# Patient Record
Sex: Male | Born: 1965 | Race: White | Hispanic: No | Marital: Married | Smoking: Never smoker
Health system: Southern US, Community
[De-identification: ages and names within clinical notes are randomized; demographics above are authoritative.]

## PROBLEM LIST (undated history)

## (undated) DIAGNOSIS — I10 Essential (primary) hypertension: Secondary | ICD-10-CM

## (undated) DIAGNOSIS — L4050 Arthropathic psoriasis, unspecified: Secondary | ICD-10-CM

## (undated) HISTORY — DX: Arthropathic psoriasis, unspecified: L40.50

## (undated) HISTORY — DX: Essential (primary) hypertension: I10

---

## 2019-06-11 ENCOUNTER — Encounter: Payer: Self-pay | Admitting: Sports Medicine

## 2019-06-11 ENCOUNTER — Ambulatory Visit (INDEPENDENT_AMBULATORY_CARE_PROVIDER_SITE_OTHER): Payer: BC Managed Care – PPO | Admitting: Sports Medicine

## 2019-06-11 ENCOUNTER — Ambulatory Visit (INDEPENDENT_AMBULATORY_CARE_PROVIDER_SITE_OTHER): Payer: BC Managed Care – PPO

## 2019-06-11 ENCOUNTER — Other Ambulatory Visit: Payer: Self-pay

## 2019-06-11 ENCOUNTER — Telehealth: Payer: Self-pay

## 2019-06-11 DIAGNOSIS — M25512 Pain in left shoulder: Secondary | ICD-10-CM

## 2019-06-11 DIAGNOSIS — M25511 Pain in right shoulder: Secondary | ICD-10-CM

## 2019-06-11 DIAGNOSIS — M25532 Pain in left wrist: Secondary | ICD-10-CM

## 2019-06-11 DIAGNOSIS — L4050 Arthropathic psoriasis, unspecified: Secondary | ICD-10-CM | POA: Diagnosis not present

## 2019-06-11 MED ORDER — MELOXICAM 15 MG PO TABS: ORAL_TABLET | ORAL | 3 refills | Status: AC

## 2019-06-11 NOTE — Assessment & Plan Note (Signed)
Currently under management with rheumatology on Cosentyx.

## 2019-06-11 NOTE — Telephone Encounter (Signed)
Patient advised.

## 2019-06-11 NOTE — Telephone Encounter (Signed)
I explained to him that there would be a period of worsening pain before he got better.  He can do some ibuprofen or meloxicam.

## 2019-06-11 NOTE — Assessment & Plan Note (Signed)
Bilateral glenohumeral joint injections today. Formal PT. Bilateral x-rays, bilateral MRIs. He does have significant weakness concerning for rotator cuff tear. Symptoms are also referrable to the biceps tendons. Also switching him to meloxicam.

## 2019-06-11 NOTE — Progress Notes (Signed)
Subjective:    CC: Bilateral shoulder pain  HPI:  This is a pleasant 53 year old male, from past several weeks has had severe pain in the anterior aspect of both shoulders, right worse than left, moderate sensation of instability, weakness without injury.  He does have a history of psoriatic arthritis currently on Cosentyx.  In addition he has noted swelling on his left volar wrist, radial aspect, with pain that is moderate, persistent, localized without radiation.  No paresthesias into the hand or fingertips.  No trauma.  I reviewed the past medical history, family history, social history, surgical history, and allergies today and no changes were needed.  Please see the problem list section below in epic for further details.  Past Medical History: Past Medical History:  Diagnosis Date  . Hypertension   . Psoriatic arthritis (HCC)    Past Surgical History: History reviewed. No pertinent surgical history. Social History: Social History   Socioeconomic History  . Marital status: Married    Spouse name: Not on file  . Number of children: Not on file  . Years of education: Not on file  . Highest education level: Not on file  Occupational History  . Not on file  Social Needs  . Financial resource strain: Not on file  . Food insecurity    Worry: Not on file    Inability: Not on file  . Transportation needs    Medical: Not on file    Non-medical: Not on file  Tobacco Use  . Smoking status: Not on file  Substance and Sexual Activity  . Alcohol use: Not on file  . Drug use: Not on file  . Sexual activity: Not on file  Lifestyle  . Physical activity    Days per week: Not on file    Minutes per session: Not on file  . Stress: Not on file  Relationships  . Social Musicianconnections    Talks on phone: Not on file    Gets together: Not on file    Attends religious service: Not on file    Active member of club or organization: Not on file    Attends meetings of clubs or  organizations: Not on file    Relationship status: Not on file  Other Topics Concern  . Not on file  Social History Narrative  . Not on file   Family History: No family history on file. Allergies: Not on File Medications: See med rec.  Review of Systems: No headache, visual changes, nausea, vomiting, diarrhea, constipation, dizziness, abdominal pain, skin rash, fevers, chills, night sweats, swollen lymph nodes, weight loss, chest pain, body aches, joint swelling, muscle aches, shortness of breath, mood changes, visual or auditory hallucinations.  Objective:    General: Well Developed, well nourished, and in no acute distress.  Neuro: Alert and oriented x3, extra-ocular muscles intact, sensation grossly intact.  HEENT: Normocephalic, atraumatic, pupils equal round reactive to light, neck supple, no masses, no lymphadenopathy, thyroid nonpalpable.  Skin: Warm and dry, no rashes noted.  Cardiac: Regular rate and rhythm, no murmurs rubs or gallops.  Respiratory: Clear to auscultation bilaterally. Not using accessory muscles, speaking in full sentences.  Abdominal: Soft, nontender, nondistended, positive bowel sounds, no masses, no organomegaly.  Bilateral shoulders: Inspection reveals no abnormalities, atrophy or asymmetry. Palpation is normal with no tenderness over AC joint or bicipital groove. ROM is full in all planes. Rotator cuff strength very weak to abduction Positive Neer and Hawkin's tests, empty can. Speeds and Yergason's tests positive.  No labral pathology noted with negative Obrien's, negative crank, negative clunk, and good stability. Normal scapular function observed. No painful arc and no drop arm sign. No apprehension sign Left wrist: Fullness over the left volar wrist, palpation is consistent with a volar ganglion ROM smooth and normal with good flexion and extension and ulnar/radial deviation that is symmetrical with opposite wrist. Palpation is normal over  metacarpals, navicular, lunate, and TFCC; tendons without tenderness/ swelling No snuffbox tenderness. No tenderness over Canal of Guyon. Strength 5/5 in all directions without pain. Negative tinel's and phalens signs. Negative Finkelstein sign. Negative Watson's test.  Procedure: Real-time Ultrasound Guided injection of the right glenohumeral joint Device: GE Logiq E  Verbal informed consent obtained.  Time-out conducted.  Noted no overlying erythema, induration, or other signs of local infection.  Skin prepped in a sterile fashion.  Local anesthesia: Topical Ethyl chloride.  With sterile technique and under real time ultrasound guidance:  1 cc Kenalog 40, 2 cc lidocaine, 2 cc bupivacaine injected easily. Completed without difficulty  Pain immediately resolved suggesting accurate placement of the medication.  Advised to call if fevers/chills, erythema, induration, drainage, or persistent bleeding.  Images permanently stored and available for review in the ultrasound unit.  Impression: Technically successful ultrasound guided injection.  Procedure: Real-time Ultrasound Guided injection of the left glenohumeral joint Device: GE Logiq E  Verbal informed consent obtained.  Time-out conducted.  Noted no overlying erythema, induration, or other signs of local infection.  Skin prepped in a sterile fashion.  Local anesthesia: Topical Ethyl chloride.  With sterile technique and under real time ultrasound guidance:  1 cc Kenalog 40, 2 cc lidocaine, 2 cc bupivacaine injected easily. Completed without difficulty  Pain immediately resolved suggesting accurate placement of the medication.  Advised to call if fevers/chills, erythema, induration, drainage, or persistent bleeding.  Images permanently stored and available for review in the ultrasound unit.  Impression: Technically successful ultrasound guided injection.   Impression and Recommendations:    The patient was counselled, risk  factors were discussed, anticipatory guidance given.  Psoriatic arthritis ([place]) Currently under management with rheumatology on Cosentyx.  Bilateral shoulder pain Bilateral glenohumeral joint injections today. Formal PT. Bilateral x-rays, bilateral MRIs. He does have significant weakness concerning for rotator cuff tear. Symptoms are also referrable to the biceps tendons. Also switching him to meloxicam.  Left wrist pain There is what appears to be a volar ganglion cyst, I do want some x-rays today.   ___________________________________________ Gwen Her. Dianah Field, M.D., ABFM., CAQSM. Primary Care and Sports Medicine [facility]  Adjunct Professor of [employer]

## 2019-06-11 NOTE — Assessment & Plan Note (Signed)
There is what appears to be a volar ganglion cyst, I do want some x-rays today.

## 2019-06-11 NOTE — Telephone Encounter (Signed)
[facility] called and states he is having a worsening of shoulder pain. He states he is having trouble lifting his arms up. He states he didn't have this before the injection.

## 2019-06-12 ENCOUNTER — Other Ambulatory Visit: Payer: Self-pay

## 2019-06-12 ENCOUNTER — Ambulatory Visit (INDEPENDENT_AMBULATORY_CARE_PROVIDER_SITE_OTHER): Payer: BC Managed Care – PPO

## 2019-06-12 DIAGNOSIS — R531 Weakness: Secondary | ICD-10-CM

## 2019-06-12 DIAGNOSIS — M25511 Pain in right shoulder: Secondary | ICD-10-CM | POA: Diagnosis not present

## 2019-06-12 DIAGNOSIS — M7582 Other shoulder lesions, left shoulder: Secondary | ICD-10-CM

## 2019-06-13 ENCOUNTER — Encounter: Payer: Self-pay | Admitting: Sports Medicine

## 2019-06-13 NOTE — Progress Notes (Signed)
Pt has seen results on MyChart and he also sent a message on how he was feeling. It was sent to PCP.

## 2019-06-21 ENCOUNTER — Ambulatory Visit: Payer: BC Managed Care – PPO | Admitting: Rehabilitative and Restorative Service Providers"

## 2019-07-09 ENCOUNTER — Ambulatory Visit: Payer: Self-pay | Admitting: Sports Medicine

## 2019-09-26 ENCOUNTER — Ambulatory Visit: Payer: BC Managed Care – PPO | Admitting: Sports Medicine

## 2019-09-27 ENCOUNTER — Ambulatory Visit (INDEPENDENT_AMBULATORY_CARE_PROVIDER_SITE_OTHER): Payer: BC Managed Care – PPO | Admitting: Sports Medicine

## 2019-09-27 ENCOUNTER — Other Ambulatory Visit: Payer: Self-pay

## 2019-09-27 DIAGNOSIS — M25512 Pain in left shoulder: Secondary | ICD-10-CM

## 2019-09-27 DIAGNOSIS — L4050 Arthropathic psoriasis, unspecified: Secondary | ICD-10-CM

## 2019-09-27 DIAGNOSIS — M25511 Pain in right shoulder: Secondary | ICD-10-CM

## 2019-09-27 DIAGNOSIS — G8929 Other chronic pain: Secondary | ICD-10-CM | POA: Diagnosis not present

## 2019-09-27 NOTE — Assessment & Plan Note (Signed)
Bilateral glenohumeral injections, last done in August. Formal physical therapy. MRIs were obtained at the last visit.

## 2019-09-27 NOTE — Assessment & Plan Note (Signed)
Continue Cosentyx, it sounds as though he is going to need methotrexate.

## 2019-09-27 NOTE — Progress Notes (Signed)
Subjective:    CC: Shoulder pain  HPI: This is a pleasant 53 year old male, he has bilateral shoulder pain, also has a history of psoriatic arthritis on Cosentyx.  We did bilateral glenohumeral injections approximately 3 months ago and he had fantastic relief, now having recurrence of pain, he was exposed to Covid and was unable to do physical therapy.  I reviewed the past medical history, family history, social history, surgical history, and allergies today and no changes were needed.  Please see the problem list section below in epic for further details.  Past Medical History: Past Medical History:  Diagnosis Date  . Hypertension   . Psoriatic arthritis [facility])    Past Surgical History: Past Surgical History:  Procedure Laterality Date  . NO PAST SURGERIES     Social History: Social History   Socioeconomic History  . Marital status: Married    Spouse name: Not on file  . Number of children: Not on file  . Years of education: Not on file  . Highest education level: Not on file  Occupational History  . Not on file  Tobacco Use  . Smoking status: Not on file  Substance and Sexual Activity  . Alcohol use: Not on file  . Drug use: Not on file  . Sexual activity: Not on file  Other Topics Concern  . Not on file  Social History Narrative  . Not on file   Social Determinants of Health   Financial Resource Strain:   . Difficulty of Paying Living Expenses: Not on file  Food Insecurity:   . Worried About Charity fundraiser in the Last Year: Not on file  . Ran Out of Food in the Last Year: Not on file  Transportation Needs:   . Lack of Transportation (Medical): Not on file  . Lack of Transportation (Non-Medical): Not on file  Physical Activity:   . Days of Exercise per Week: Not on file  . Minutes of Exercise per Session: Not on file  Stress:   . Feeling of Stress : Not on file  Social Connections:   . Frequency of Communication with Friends and Family: Not on file    . Frequency of Social Gatherings with Friends and Family: Not on file  . Attends Religious Services: Not on file  . Active Member of Clubs or Organizations: Not on file  . Attends Archivist Meetings: Not on file  . Marital Status: Not on file   Family History: No family history on file. Allergies: Allergies  Allergen Reactions  . Sulfamethoxazole-Trimethoprim Hives   Medications: See med rec.  Review of Systems: No fevers, chills, night sweats, weight loss, chest pain, or shortness of breath.   Objective:    General: Well Developed, well nourished, and in no acute distress.  Neuro: Alert and oriented x3, extra-ocular muscles intact, sensation grossly intact.  HEENT: Normocephalic, atraumatic, pupils equal round reactive to light, neck supple, no masses, no lymphadenopathy, thyroid nonpalpable.  Skin: Warm and dry, no rashes. Cardiac: Regular rate and rhythm, no murmurs rubs or gallops, no lower extremity edema.  Respiratory: Clear to auscultation bilaterally. Not using accessory muscles, speaking in full sentences.  Procedure: Real-time Ultrasound Guided injection of the left glenohumeral joint Device: Samsung HS60  Verbal informed consent obtained.  Time-out conducted.  Noted no overlying erythema, induration, or other signs of local infection.  Skin prepped in a sterile fashion.  Local anesthesia: Topical Ethyl chloride.  With sterile technique and under real time ultrasound  guidance: 1 cc Kenalog 40, 2 cc lidocaine, 2 cc bupivacaine injected easily Completed without difficulty  Pain immediately resolved suggesting accurate placement of the medication.  Advised to call if fevers/chills, erythema, induration, drainage, or persistent bleeding.  Images permanently stored and available for review in the ultrasound unit.  Impression: Technically successful ultrasound guided injection.  Procedure: Real-time Ultrasound Guided injection of the right glenohumeral  joint Device: Samsung HS60  Verbal informed consent obtained.  Time-out conducted.  Noted no overlying erythema, induration, or other signs of local infection.  Skin prepped in a sterile fashion.  Local anesthesia: Topical Ethyl chloride.  With sterile technique and under real time ultrasound guidance: 1 cc Kenalog 40, 2 cc lidocaine, 2 cc bupivacaine injected easily Completed without difficulty  Pain immediately resolved suggesting accurate placement of the medication.  Advised to call if fevers/chills, erythema, induration, drainage, or persistent bleeding.  Images permanently stored and available for review in the ultrasound unit.  Impression: Technically successful ultrasound guided injection.  Impression and Recommendations:    Bilateral shoulder pain Bilateral glenohumeral injections, last done in August. Formal physical therapy. MRIs were obtained at the last visit.   Psoriatic arthritis (HCC) Continue Cosentyx, it sounds as though he is going to need methotrexate.   ___________________________________________ Joseph Mathews. Benjamin Stain, M.D., ABFM., CAQSM. Primary Care and Sports Medicine [facility]  Adjunct Professor of Family Medicine  [employer]

## 2019-10-09 ENCOUNTER — Ambulatory Visit (INDEPENDENT_AMBULATORY_CARE_PROVIDER_SITE_OTHER): Payer: BC Managed Care – PPO | Admitting: Physical Therapy

## 2019-10-09 ENCOUNTER — Encounter: Payer: Self-pay | Admitting: Physical Therapy

## 2019-10-09 ENCOUNTER — Other Ambulatory Visit: Payer: Self-pay

## 2019-10-09 DIAGNOSIS — M25611 Stiffness of right shoulder, not elsewhere classified: Secondary | ICD-10-CM | POA: Diagnosis not present

## 2019-10-09 DIAGNOSIS — M25612 Stiffness of left shoulder, not elsewhere classified: Secondary | ICD-10-CM | POA: Diagnosis not present

## 2019-10-09 DIAGNOSIS — M79642 Pain in left hand: Secondary | ICD-10-CM

## 2019-10-09 DIAGNOSIS — M25512 Pain in left shoulder: Secondary | ICD-10-CM | POA: Diagnosis not present

## 2019-10-09 DIAGNOSIS — M25511 Pain in right shoulder: Secondary | ICD-10-CM | POA: Diagnosis not present

## 2019-10-09 DIAGNOSIS — M25541 Pain in joints of right hand: Secondary | ICD-10-CM

## 2019-10-09 DIAGNOSIS — G8929 Other chronic pain: Secondary | ICD-10-CM

## 2019-10-09 DIAGNOSIS — R29898 Other symptoms and signs involving the musculoskeletal system: Secondary | ICD-10-CM

## 2019-10-09 NOTE — Patient Instructions (Signed)
Access Code: [facility]  URL: [email or web address]  Date: 10/09/2019  Prepared by: Madelyn Flavors   Exercises Supine Shoulder Flexion Extension AAROM with Dowel - 10 reps - 5 seconds hold - 1x daily - 7x weekly Supine Shoulder External Rotation with Dowel - 10 reps - 5 seconds hold - 1x daily - 7x weekly Shoulder Scaption AAROM with Dowel - 10 reps - 3-5 seconds hold - 1x daily - 7x weekly Doorway Pec Stretch at 90 Degrees Abduction - 3 reps - 1 sets - 30-60seconds hold - 2x daily - 7x weekly Patient Education Trigger Point Dry Needling

## 2019-10-09 NOTE — Therapy (Signed)
[facility] [address] [place], [place], [address] Phone: [phone]   Fax:  [phone]  Physical Therapy Evaluation  Patient Details  Name: Joseph Mathews MRN: [id number] Date of Birth: 1966-07-08 Referring Provider (PT): Benjamin Stainhekkekandam   Encounter Date: 10/09/2019  PT End of Session - 10/09/19 0802    Visit Number  1    Number of Visits  12    Date for PT Re-Evaluation  11/20/19    PT Start Time  0803    PT Stop Time  0849    PT Time Calculation (min)  46 min    Activity Tolerance  Patient tolerated treatment well    Behavior During Therapy  [facility] for tasks assessed/performed       Past Medical History:  Diagnosis Date  . Hypertension   . Psoriatic arthritis [facility](HCC)     Past Surgical History:  Procedure Laterality Date  . NO PAST SURGERIES      There were no vitals filed for this visit.   Subjective Assessment - 10/09/19 0804    Subjective  Patient began having bil shoulder pain beginning in the past year. Patient had injections in bil shoulders in December and August. They give him relief. The right shoulder is worse. Reaching back to nightstand gives him pain. Patient also reports pain in hands and low back.    Pertinent History  psoriatic arthritis, HTN, low back pain    Diagnostic tests  mri -    Patient Stated Goals  be able to play golf; pt manages a golf course    Currently in Pain?  Yes    Pain Score  4    prior to injection 7-8/10   Pain Location  Shoulder    Pain Orientation  Right;Left    Pain Descriptors / Indicators  Aching    Pain Type  Chronic pain    Pain Onset  More than a month ago    Pain Frequency  Intermittent    Aggravating Factors   reaching backward    Pain Relieving Factors  cortisone injections    Effect of Pain on Daily Activities  unable to play golf         [facility] - 10/09/19 0001      Assessment   Medical Diagnosis  bil shoulder pain    Referring Provider  (PT)  Thekkekandam    Onset Date/Surgical Date  11/11/18    Next MD Visit  4 weeks    Prior Therapy  no      Precautions   Precautions  Other (comment)    Precaution Comments  sulfa allergy      Restrictions   Weight Bearing Restrictions  No      Balance Screen   Has the patient fallen in the past 6 months  No    Has the patient had a decrease in activity level because of a fear of falling?   No    Is the patient reluctant to leave their home because of a fear of falling?   No      Home Public house managernvironment   Living Environment  Private residence      Prior Function   Level of Independence  Independent    Vocation  Full time employment    Vocation Requirements  manages a golf course    Leisure  golf      ROM / Strength   AROM / PROM / Strength  AROM;PROM;Strength  AROM   Overall AROM Comments  Lumbar flex limited by HS, rot 50% bil, Rt SB 75%, left SB 50%, ext full    AROM Assessment Site  Shoulder    Right/Left Shoulder  Right;Left    Right Shoulder Flexion  148 Degrees    Right Shoulder ABduction  147 Degrees    Right Shoulder Internal Rotation  54 Degrees    Right Shoulder External Rotation  63 Degrees    Left Shoulder Flexion  145 Degrees    Left Shoulder ABduction  133 Degrees    Left Shoulder Internal Rotation  68 Degrees    Left Shoulder External Rotation  78 Degrees      PROM   PROM Assessment Site  Shoulder    Right/Left Shoulder  Right;Left    Right Shoulder Flexion  163 Degrees    Right Shoulder ABduction  150 Degrees    Right Shoulder Internal Rotation  70 Degrees    Right Shoulder External Rotation  73 Degrees    Left Shoulder Flexion  152 Degrees    Left Shoulder ABduction  152 Degrees    Left Shoulder Internal Rotation  83 Degrees    Left Shoulder External Rotation  84 Degrees   pain     Strength   Overall Strength Comments  left elbow flex/ext 4/5, right 5/5', right low/mid trap 3+/5 with poor motion in scaption; left 4-/5 mid and low trap     Strength Assessment Site  Shoulder    Right/Left Shoulder  Right;Left    Right Shoulder Flexion  4/5   pain   Right Shoulder Extension  4/5   pain   Right Shoulder ABduction  4+/5   pain   Right Shoulder Internal Rotation  5/5    Right Shoulder External Rotation  4-/5    Left Shoulder Flexion  4/5    Left Shoulder Extension  5/5    Left Shoulder ABduction  4+/5    Left Shoulder Internal Rotation  5/5    Left Shoulder External Rotation  4/5   pain     Palpation   Spinal mobility  Decreased PA mobility in lumbar and thoracic spine throughout    Palpation comment  left subscap and SS; right subscap, SS, IS, TM insertions tender; bil pectorals tender      Special Tests   Other special tests  neg HK, positive Impingement bil                 Objective measurements completed on examination: See above findings.              PT Education - 10/09/19 0846    Education Details  HEP/ DN    Person(s) Educated  Patient    Methods  Explanation;Demonstration;Handout    Comprehension  Verbalized understanding;Returned demonstration          PT Long Term Goals - 10/09/19 1457      PT LONG TERM GOAL #1   Title  Independent with HEP for strengthening and flexibility to increase function.    Time  6    Period  Weeks    Status  New    Target Date  11/20/19      PT LONG TERM GOAL #2   Title  Patient to report decreased pain in bil shoulders with ADLS to 0/30 or less including reaching back toward his nightstand.    Time  6    Period  Weeks    Status  New  PT LONG TERM GOAL #3   Title  Patient to demo increased shoulder and spinal ROM to [facility] to perform ADLs and golf.    Time  6    Period  Weeks    Status  New      PT LONG TERM GOAL #4   Title  Patient to demo 5/5 strength in bil shoulders to improve ADLs and increase function.    Time  6    Period  Weeks    Status  New      PT LONG TERM GOAL #5   Title  Patient to report decreased pain in hands by 50%  with palpation.    Time  6    Period  Weeks    Status  New             Plan - 10/09/19 0849    Clinical Impression Statement  Patient presents with c/o chronic bil shoulder pain, hand pain and low back pain. He has decreased ROM and strength in bil shoulders as well as tightness in pectorals. He has significant tightness in his lumbar spine which may be contributing to shoulder issues as well. Patient's main complaint is horizontal ABDuction. His pain and RoM deficits are also limiting golfing. 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 he also has bil hand pain and tightness in hand/finger muscles which would benefit from DN. Patient will benefit from PT to decrease pain and increase strength and mobility to normalize ADLS.    Personal Factors and Comorbidities  Comorbidity 3+    Comorbidities  psoriatic arthritis, HTN, low back pain    Stability/Clinical Decision Making  Evolving/Moderate complexity    Clinical Decision Making  Low    Rehab Potential  Excellent    PT Frequency  2x / week    PT Duration  6 weeks    PT Treatment/Interventions  ADLs/Self Care Home Management;Cryotherapy;Electrical Stimulation;Moist Heat;Ultrasound;Therapeutic exercise;Neuromuscular re-education;Manual techniques;Patient/family education;Dry needling;Joint Manipulations;Spinal Manipulations;Taping    PT Next Visit Plan  review HEP, Shoulder ROM/strength, lumbar ROM/flexibility, DN hands/shoulder    PT Home Exercise Plan  [facility]    Consulted and Agree with Plan of Care  Patient       Patient will benefit from skilled therapeutic intervention in order to improve the following deficits and impairments:  Decreased range of motion, Pain, Increased muscle spasms, Impaired flexibility, Postural dysfunction, Decreased strength, Decreased activity tolerance, Impaired UE functional use  Visit Diagnosis: Stiffness of left shoulder, not elsewhere classified - Plan: PT plan of care cert/re-cert  Stiffness of right  shoulder, not elsewhere classified - Plan: PT plan of care cert/re-cert  Chronic left shoulder pain - Plan: PT plan of care cert/re-cert  Chronic right shoulder pain - Plan: PT plan of care cert/re-cert  Pain in left hand - Plan: PT plan of care cert/re-cert  Pain in joints of right hand - Plan: PT plan of care cert/re-cert  Other symptoms and signs involving the musculoskeletal system - Plan: PT plan of care cert/re-cert     Problem List Patient Active Problem List   Diagnosis Date Noted  . Bilateral shoulder pain 06/11/2019  . Psoriatic arthritis (HCC) 06/11/2019  . Left wrist pain 06/11/2019    Solon Palm PT 10/09/2019, 3:06 PM  [facility] [address] [place], [place], [address] Phone: [phone]   Fax:  [phone]  Name: Joseph Mathews MRN: [id number] Date of Birth: 10/08/1966

## 2019-10-17 ENCOUNTER — Other Ambulatory Visit: Payer: Self-pay

## 2019-10-17 ENCOUNTER — Ambulatory Visit: Payer: BC Managed Care – PPO | Admitting: Physical Therapy

## 2019-10-17 ENCOUNTER — Encounter: Payer: Self-pay | Admitting: Physical Therapy

## 2019-10-17 DIAGNOSIS — M79642 Pain in left hand: Secondary | ICD-10-CM

## 2019-10-17 DIAGNOSIS — M25612 Stiffness of left shoulder, not elsewhere classified: Secondary | ICD-10-CM | POA: Diagnosis not present

## 2019-10-17 DIAGNOSIS — M25512 Pain in left shoulder: Secondary | ICD-10-CM | POA: Diagnosis not present

## 2019-10-17 DIAGNOSIS — G8929 Other chronic pain: Secondary | ICD-10-CM

## 2019-10-17 DIAGNOSIS — R29898 Other symptoms and signs involving the musculoskeletal system: Secondary | ICD-10-CM

## 2019-10-17 DIAGNOSIS — M25511 Pain in right shoulder: Secondary | ICD-10-CM | POA: Diagnosis not present

## 2019-10-17 DIAGNOSIS — M25541 Pain in joints of right hand: Secondary | ICD-10-CM

## 2019-10-17 DIAGNOSIS — M25611 Stiffness of right shoulder, not elsewhere classified: Secondary | ICD-10-CM

## 2019-10-17 NOTE — Patient Instructions (Signed)
Access Code: [facility]  URL: [email or web address]  Date: 10/17/2019  Prepared by: Raynelle Fanning Ardyn Forge   Exercises Supine Shoulder Flexion Extension AAROM with Dowel - 10 reps - 5 seconds hold - 1x daily - 7x weekly Supine Shoulder External Rotation with Dowel - 10 reps - 5 seconds hold - 1x daily - 7x weekly Shoulder Scaption AAROM with Dowel - 10 reps - 3-5 seconds hold - 1x daily - 7x weekly Doorway Pec Stretch at 90 Degrees Abduction - 3 reps - 1 sets - 30-60seconds hold - 2x daily - 7x weekly Sidelying ITB Stretch off Table - 1 reps - 1 sets - 1-5 min hold - 1x daily - 7x weekly Shoulder External Rotation with Anchored Resistance - 10 reps - 1-3 sets - 1x daily - 7x weekly Standing Row with Anchored Resistance - 10 reps - 1-3 sets - 1x daily - 7x weekly Shoulder Internal Rotation with Resistance - 10 reps - 1-3 sets - 1x daily - 7x weekly Shoulder extension with resistance - Neutral - 10 reps - 1-3 sets - 1x daily - 7x weekly Patient Education Trigger Point Dry Needling

## 2019-10-17 NOTE — Therapy (Signed)
[facility] [place] [address] [address] [place], [place], [address] Phone: [phone]   Fax:  [phone]  Physical Therapy Treatment  Patient Details  Name: Joseph Mathews MRN: [id number] Date of Birth: 01-22-1966 Referring Provider (PT): Benjamin Stain   Encounter Date: 10/17/2019  PT End of Session - 10/17/19 1343    Visit Number  2    Number of Visits  12    Date for PT Re-Evaluation  11/20/19    Authorization Type  BCBS    PT Start Time  0145    PT Stop Time  0234    PT Time Calculation (min)  49 min    Activity Tolerance  Patient tolerated treatment well    Behavior During Therapy  [facility] for tasks assessed/performed       Past Medical History:  Diagnosis Date  . Hypertension   . Psoriatic arthritis [facility])     Past Surgical History:  Procedure Laterality Date  . NO PAST SURGERIES      There were no vitals filed for this visit.  Subjective Assessment - 10/17/19 1345    Subjective  Patient reports that his shoulders are feeling better with exercises. He stopped meloxicam yesterday and pain was 6-7/10. Just stiffness now    Currently in Pain?  No/denies                       [facility] Adult PT Treatment/Exercise - 10/17/19 0001      Exercises   Exercises  Shoulder      Shoulder Exercises: Supine   External Rotation  AAROM;10 reps   bil   External Rotation Limitations  VCs for correct form    Other Supine Exercises  elbow press x 10 bil      Shoulder Exercises: Standing   External Rotation  Strengthening;Both;15 reps    Theraband Level (Shoulder External Rotation)  Level 2 (Red)    Internal Rotation  Strengthening;Both;15 reps    Theraband Level (Shoulder Internal Rotation)  Level 2 (Red)    Extension  Both;10 reps;Theraband    Theraband Level (Shoulder Extension)  Level 3 (Green)    Row  Both;10 reps;Theraband    Theraband Level (Shoulder Row)  Level 3 (Green)      Shoulder Exercises: Stretch   Wall  Stretch - Flexion  2 reps;30 seconds    Wall Stretch - ABduction  3 reps;30 seconds   bil   Other Shoulder Stretches  SDLY left QL stretch over pillow 1x 2 min; also doorway QL stretch x 20 sec; supine reverse C stretch x 20 sec    Other Shoulder Stretches  doorway 90/90 stretch 3x30 sec      Manual Therapy   Manual Therapy  Joint mobilization;Myofascial release    Joint Mobilization  lumbar PA mobs central and unilateral left gd II/III    Myofascial Release  left QL release in [facility]             PT Education - 10/17/19 1450    Education Details  HEP updated; foam ball issued for hand    Person(s) Educated  Patient    Methods  Explanation;Demonstration;Handout    Comprehension  Verbalized understanding;Returned demonstration          PT Long Term Goals - 10/17/19 1446      PT LONG TERM GOAL #1   Title  Independent with HEP for strengthening and flexibility to increase function.    Status  On-going  Plan - 10/17/19 1443    Clinical Impression Statement  Patient reports improvement in shoulder ROM with HEP. He stopped Meloxicam for a day and pain was significant in bil hands so he started again today. Patient requested he strengthen with squeeze ball for one week and then possibly try DN if hands still painfrul. Patient very tight in left QL and had pain with PA mobs and QL release. Stretch issued for HEP. He tolerated strengthening in shoulders well.    Comorbidities  psoriatic arthritis, HTN, low back pain    PT Treatment/Interventions  ADLs/Self Care Home Management;Cryotherapy;Electrical Stimulation;Moist Heat;Ultrasound;Therapeutic exercise;Neuromuscular re-education;Manual techniques;Patient/family education;Dry needling;Joint Manipulations;Spinal Manipulations;Taping    PT Next Visit Plan  Shoulder ROM/strength, lumbar ROM/flexibility, DN hands/shoulder    PT Home Exercise Plan  [facility]    Consulted and Agree with Plan of Care  Patient       Patient  will benefit from skilled therapeutic intervention in order to improve the following deficits and impairments:  Decreased range of motion, Pain, Increased muscle spasms, Impaired flexibility, Postural dysfunction, Decreased strength, Decreased activity tolerance, Impaired UE functional use  Visit Diagnosis: Stiffness of left shoulder, not elsewhere classified  Stiffness of right shoulder, not elsewhere classified  Chronic left shoulder pain  Chronic right shoulder pain  Pain in left hand  Pain in joints of right hand  Other symptoms and signs involving the musculoskeletal system     Problem List Patient Active Problem List   Diagnosis Date Noted  . Bilateral shoulder pain 06/11/2019  . Psoriatic arthritis ([place]) 06/11/2019  . Left wrist pain 06/11/2019    Madelyn Flavors PT 10/17/2019, 2:51 PM  [facility] [place], [place], [address] Phone: [phone]   Fax:  [phone]  Name: Joseph Mathews MRN: [id number] Date of Birth: [birth year]

## 2019-10-19 ENCOUNTER — Encounter: Payer: BC Managed Care – PPO | Admitting: Physical Therapy

## 2019-10-22 ENCOUNTER — Encounter: Payer: BC Managed Care – PPO | Admitting: Physical Therapy

## 2019-10-25 ENCOUNTER — Encounter: Payer: Self-pay | Admitting: Physical Therapy

## 2019-10-25 ENCOUNTER — Other Ambulatory Visit: Payer: Self-pay

## 2019-10-25 ENCOUNTER — Ambulatory Visit (INDEPENDENT_AMBULATORY_CARE_PROVIDER_SITE_OTHER): Payer: BC Managed Care – PPO | Admitting: Sports Medicine

## 2019-10-25 ENCOUNTER — Ambulatory Visit: Payer: BC Managed Care – PPO | Admitting: Physical Therapy

## 2019-10-25 DIAGNOSIS — M25532 Pain in left wrist: Secondary | ICD-10-CM | POA: Diagnosis not present

## 2019-10-25 DIAGNOSIS — M25512 Pain in left shoulder: Secondary | ICD-10-CM

## 2019-10-25 DIAGNOSIS — G8929 Other chronic pain: Secondary | ICD-10-CM

## 2019-10-25 DIAGNOSIS — M25541 Pain in joints of right hand: Secondary | ICD-10-CM

## 2019-10-25 DIAGNOSIS — M25611 Stiffness of right shoulder, not elsewhere classified: Secondary | ICD-10-CM | POA: Diagnosis not present

## 2019-10-25 DIAGNOSIS — M25612 Stiffness of left shoulder, not elsewhere classified: Secondary | ICD-10-CM | POA: Diagnosis not present

## 2019-10-25 DIAGNOSIS — M25511 Pain in right shoulder: Secondary | ICD-10-CM

## 2019-10-25 DIAGNOSIS — R29898 Other symptoms and signs involving the musculoskeletal system: Secondary | ICD-10-CM

## 2019-10-25 DIAGNOSIS — M79642 Pain in left hand: Secondary | ICD-10-CM

## 2019-10-25 NOTE — Assessment & Plan Note (Signed)
Joseph Mathews returns, he has psoriatic arthritis on Cosentyx. Last visit I performed bilateral ultrasound-guided glenohumeral injections and he returns today pain-free. He does not MRIs in the chart.

## 2019-10-25 NOTE — Assessment & Plan Note (Signed)
Joseph Mathews's left hand and wrist pain does seem to be declaring itself as thumb basal joint. He does have a slight worsening of pain but at this point he does not desire interventional treatment today. He may return to see me for interventional treatment if needed.

## 2019-10-25 NOTE — Progress Notes (Signed)
    Procedures performed today:    None.  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  Impression and Recommendations:    Bilateral shoulder pain Merdith returns, he has psoriatic arthritis on Cosentyx. Last visit I performed bilateral ultrasound-guided glenohumeral injections and he returns today pain-free. He does not MRIs in the chart.  Left wrist pain Arik's left hand and wrist pain does seem to be declaring itself as thumb basal joint. He does have a slight worsening of pain but at this point he does not desire interventional treatment today. He may return to see me for interventional treatment if needed.    ___________________________________________ Ihor Austin. Benjamin Stain, M.D., ABFM., CAQSM. Primary Care and Sports Medicine [facility]  Adjunct Instructor of Family Medicine  [employer]

## 2019-10-25 NOTE — Therapy (Signed)
[place], [place], [address] Phone: [phone]   Fax:  [phone]  Physical Therapy Treatment  Patient Details  Name: Joseph Mathews MRN: [id number] Date of Birth: Sep 28, 1966 Referring Provider (PT): Dianah Field   Encounter Date: 10/25/2019  PT End of Session - 10/25/19 0854    Visit Number  3    Number of Visits  12    Date for PT Re-Evaluation  11/20/19    Authorization Type  BCBS    PT Start Time  0855    PT Stop Time  0941    PT Time Calculation (min)  46 min    Activity Tolerance  Patient tolerated treatment well    Behavior During Therapy  [facility] for tasks assessed/performed       Past Medical History:  Diagnosis Date  . Hypertension   . Psoriatic arthritis [facility])     Past Surgical History:  Procedure Laterality Date  . NO PAST SURGERIES      There were no vitals filed for this visit.  Subjective Assessment - 10/25/19 0856    Subjective  Shoulders are feeling great. Able to play tennis. 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  No/denies                       [facility] Adult PT Treatment/Exercise - 10/25/19 0001      Shoulder Exercises: Supine   Other Supine Exercises  elbow press x 5 on noodle bil      Shoulder Exercises: Standing   External Rotation  Strengthening;Both;15 reps    Theraband Level (Shoulder External Rotation)  Level 2 (Red)    Internal Rotation  Strengthening;Both;15 reps    Theraband Level (Shoulder Internal Rotation)  Level 2 (Red)    Extension  Right;Left;15 reps    Theraband Level (Shoulder Extension)  Level 3 (Green)    Row  Both;10 reps;Theraband    Theraband Level (Shoulder Row)  Level 3 (Green)    Row Limitations  requires VCs and TCs for correct form    Diagonals   Both;15 reps;Theraband    Theraband Level (Shoulder Diagonals)  Level 1 (Yellow)    Diagonals Limitations  PNF D!/2 flex /ext bil ; left D2 ext no band due to pain      Shoulder Exercises: Stretch   Other Shoulder Stretches  foam roller vertical: 90/90 2x30 sec ; on table Y stretch with pillow under upper arm 2x30 bil      Manual Therapy   Manual Therapy  Soft tissue mobilization    Manual therapy comments  skilled palpation and monitoring of soft tissue during DN      Soft tissue mobilization  to adductor pollicis and 1st interossei       Trigger Point Dry Needling - 10/25/19 0001    Consent Given?  Yes    Education Handout Provided  Previously provided    Other Dry Needling  right ADD pollicis, 1st interossei                PT Long Term Goals - 10/25/19 0859      PT LONG TERM GOAL #1   Title  Independent with HEP for strengthening and flexibility to increase  function.    Status  On-going      PT LONG TERM GOAL #2   Title  Patient to report decreased pain in bil shoulders with ADLS to 2/10 or less including reaching back toward his nightstand.    Baseline  6/10 at end range    Status  On-going      PT LONG TERM GOAL #3   Title  Patient to demo increased shoulder and spinal ROM to [facility] to perform ADLs and golf.    Status  On-going      PT LONG TERM GOAL #4   Title  Patient to demo 5/5 strength in bil shoulders to improve ADLs and increase function.    Status  On-going      PT LONG TERM GOAL #5   Title  Patient to report decreased pain in hands by 50% with palpation.    Status  On-going            Plan - 10/25/19 0948    Clinical Impression Statement  Patient presents with reports that his shoulders are feeling great. He still has pain at end range flex and horizontal ABD of 6/10 however. He has marked tightness in pectorals, left>right. He tolerated PNF fairly well except D2 ext on left. He responded well to DN in right hand. Patient continues to c/o of LBP  but doing stretches daily.    Personal Factors and Comorbidities  Comorbidity 3+    Comorbidities  psoriatic arthritis, HTN, low back pain    PT Frequency  2x / week    PT Duration  6 weeks    PT Treatment/Interventions  ADLs/Self Care Home Management;Cryotherapy;Electrical Stimulation;Moist Heat;Ultrasound;Therapeutic exercise;Neuromuscular re-education;Manual techniques;Patient/family education;Dry needling;Joint Manipulations;Spinal Manipulations;Taping    PT Next Visit Plan  assess DN of hand, cont Shoulder ROM/strength, DN hands/shoulder, spinal mobility/flexibiltiy    PT Home Exercise Plan  [facility]       Patient will benefit from skilled therapeutic intervention in order to improve the following deficits and impairments:  Decreased range of motion, Pain, Increased muscle spasms, Impaired flexibility, Postural dysfunction, Decreased strength, Decreased activity tolerance, Impaired UE functional use  Visit Diagnosis: Stiffness of left shoulder, not elsewhere classified  Stiffness of right shoulder, not elsewhere classified  Chronic left shoulder pain  Chronic right shoulder pain  Pain in left hand  Pain in joints of right hand  Other symptoms and signs involving the musculoskeletal system     Problem List Patient Active Problem List   Diagnosis Date Noted  . Bilateral shoulder pain 06/11/2019  . Psoriatic arthritis ([facility]) 06/11/2019  . Left wrist pain 06/11/2019    Solon Palm PT 10/25/2019, 9:55 AM  [facility] [address] [place], [place], [address] Phone: [phone]   Fax:  [phone]  Name: Deacon Gadbois MRN: [id number] Date of Birth: November 29, 1965

## 2019-11-01 ENCOUNTER — Encounter: Payer: Self-pay | Admitting: Physical Therapy

## 2019-11-01 ENCOUNTER — Other Ambulatory Visit: Payer: Self-pay

## 2019-11-01 ENCOUNTER — Ambulatory Visit: Payer: BC Managed Care – PPO | Admitting: Physical Therapy

## 2019-11-01 DIAGNOSIS — M25612 Stiffness of left shoulder, not elsewhere classified: Secondary | ICD-10-CM

## 2019-11-01 DIAGNOSIS — M25611 Stiffness of right shoulder, not elsewhere classified: Secondary | ICD-10-CM

## 2019-11-01 DIAGNOSIS — M25512 Pain in left shoulder: Secondary | ICD-10-CM

## 2019-11-01 DIAGNOSIS — M25511 Pain in right shoulder: Secondary | ICD-10-CM | POA: Diagnosis not present

## 2019-11-01 DIAGNOSIS — M79642 Pain in left hand: Secondary | ICD-10-CM

## 2019-11-01 DIAGNOSIS — R29898 Other symptoms and signs involving the musculoskeletal system: Secondary | ICD-10-CM

## 2019-11-01 DIAGNOSIS — M25541 Pain in joints of right hand: Secondary | ICD-10-CM

## 2019-11-01 DIAGNOSIS — G8929 Other chronic pain: Secondary | ICD-10-CM

## 2019-11-01 NOTE — Therapy (Addendum)
[place], [place], [address] Phone: [phone]   Fax:  [phone]  Physical Therapy Treatment and Discharge Summary  Patient Details  Name: Joseph Mathews MRN: [id number] Date of Birth: 1966-09-22 Referring Provider (PT): Thekkekandam   Encounter Date: 11/01/2019  PT End of Session - 11/01/19 1105    Visit Number  4    Number of Visits  12    Date for PT Re-Evaluation  11/20/19    Authorization Type  BCBS    PT Start Time  1105    PT Stop Time  1149    PT Time Calculation (min)  44 min    Activity Tolerance  Patient tolerated treatment well    Behavior During Therapy  [facility] for tasks assessed/performed       Past Medical History:  Diagnosis Date  . Hypertension   . Psoriatic arthritis [facility])     Past Surgical History:  Procedure Laterality Date  . NO PAST SURGERIES      There were no vitals filed for this visit.  Subjective Assessment - 11/01/19 1105    Subjective  Patient had to take a couple of days off from stretching due to low back pain. The shoulders are much better.    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  No/denies         [facility] PT Assessment - 11/01/19 0001      AROM   Overall AROM Comments  flex/aBD in standing for active and passive    Right Shoulder Flexion  150 Degrees    Right Shoulder ABduction  165 Degrees    Right Shoulder Internal Rotation  65 Degrees    Right Shoulder External Rotation  76 Degrees    Left Shoulder Flexion  160 Degrees    Left Shoulder ABduction  165 Degrees    Left Shoulder Internal Rotation  84 Degrees    Left Shoulder External Rotation  70 Degrees      PROM   Right Shoulder Flexion  157 Degrees    Right Shoulder Internal Rotation  70 Degrees    Right Shoulder External Rotation  80 Degrees    Left Shoulder Flexion  167  Degrees    Left Shoulder Internal Rotation  90 Degrees    Left Shoulder External Rotation  75 Degrees      Strength   Right Shoulder Flexion  4/5   pain   Right Shoulder Extension  4+/5    Right Shoulder ABduction  5/5    Right Shoulder Internal Rotation  5/5    Right Shoulder External Rotation  4+/5    Left Shoulder Flexion  4+/5    Left Shoulder Extension  5/5    Left Shoulder ABduction  4+/5    Left Shoulder Internal Rotation  5/5    Left Shoulder External Rotation  4+/5                   [facility] Adult PT Treatment/Exercise - 11/01/19 0001      Exercises   Exercises  Lumbar      Lumbar Exercises: Stretches   Single Knee to Chest Stretch  Right;Left;2 reps;30 seconds    Lower Trunk Rotation  3 reps;10 seconds    Lower Trunk Rotation Limitations  bil    Other Lumbar Stretch Exercise  open book x 3 x 20 sec each direction      Shoulder Exercises: Supine   Flexion  Strengthening;Both;20 reps;Theraband    Theraband Level (Shoulder Flexion)  Level 2 (Red)    Flexion Limitations  behind the back with crisscrossed band in front      Shoulder Exercises: Standing   Diagonals  Both;10 reps;Weights    Diagonals Weight (lbs)  2    Diagonals Limitations  then in supine x 10 due unstable core    Other Standing Exercises  flex/scaption/ABD 2# 2x10 ea             PT Education - 11/01/19 1248    Education Details  progressed HEP    Person(s) Educated  Patient    Methods  Explanation;Demonstration;Handout    Comprehension  Verbalized understanding;Returned demonstration          PT Long Term Goals - 11/01/19 1107      PT LONG TERM GOAL #1   Title  Independent with HEP for strengthening and flexibility to increase function.    Status  Partially Met      PT LONG TERM GOAL #2   Title  Patient to report decreased pain in bil shoulders with ADLS to 7/51 or less including reaching back toward his nightstand.    Baseline  3-4/10 pain when reaching back    Status   On-going      PT LONG TERM GOAL #3   Title  Patient to demo increased shoulder and spinal ROM to [facility] to perform ADLs and golf.    Status  Partially Met      PT LONG TERM GOAL #4   Title  Patient to demo 5/5 strength in bil shoulders to improve ADLs and increase function.    Status  Partially Met      PT LONG TERM GOAL #5   Title  Patient to report decreased pain in hands by 50% with palpation.    Status  On-going            Plan - 11/01/19 1248    Clinical Impression Statement  Patient presents with reports of improvement overall in shoulders. He was unable to assess response to DN in hands due to having Cosentx injection right after. Patient is progressing very well with strength and ROM in both shoulders, but continues to have LBP. He is very tight with trunk rotation and we worked on spinal mobility today.    Comorbidities  psoriatic arthritis, HTN, low back pain    PT Frequency  2x / week    PT Duration  6 weeks    PT Treatment/Interventions  ADLs/Self Care Home Management;Cryotherapy;Electrical Stimulation;Moist Heat;Ultrasound;Therapeutic exercise;Neuromuscular re-education;Manual techniques;Patient/family education;Dry needling;Joint Manipulations;Spinal Manipulations;Taping    PT Next Visit Plan  cont Shoulder ROM/strength, DN hands/shoulder, spinal mobility/flexibiltiy    PT Home Exercise Plan  [facility]    Consulted and Agree with Plan of Care  Patient       Patient will benefit from skilled therapeutic intervention in order to improve the following deficits and impairments:  Decreased range of motion, Pain, Increased muscle spasms, Impaired flexibility, Postural dysfunction, Decreased strength, Decreased activity tolerance, Impaired UE functional use  Visit Diagnosis: Stiffness of left shoulder, not elsewhere classified  Stiffness of right shoulder, not elsewhere classified  Chronic left shoulder pain  Chronic right shoulder pain  Pain in left hand  Pain in  joints of right hand  Other symptoms and signs involving the musculoskeletal system  Problem List Patient Active Problem List   Diagnosis Date Noted  . Bilateral shoulder pain 06/11/2019  . Psoriatic arthritis ([place]) 06/11/2019  . Left wrist pain 06/11/2019   Madelyn Flavors PT 11/01/2019, 12:56 PM  [facility] [place], [place], [address] Phone: [phone]   Fax:  [phone]  Name: Joseph Mathews MRN: [id number] Date of Birth: 12-Dec-1965  PHYSICAL THERAPY DISCHARGE SUMMARY  Visits from Start of Care: 4  Current functional level related to goals / functional outcomes: Unable to assess as pt did not return for f/u visits.   Remaining deficits: See above   Education / Equipment: HEP  Plan: Patient agrees to discharge.  Patient goals were partially met. Patient is being discharged due to not returning since the last visit.  ?????    Madelyn Flavors, PT 01/31/20 3:05 PM  [facility] [place],  [address]  [phone] (office) [phone] (fax)

## 2019-11-01 NOTE — Patient Instructions (Signed)
Access Code: [facility]  URL: [email or web address]  Date: 11/01/2019  Prepared by: Raynelle Fanning Rogan Wigley   Exercises Supine Shoulder Flexion Extension AAROM with Dowel - 10 reps - 5 seconds hold - 1x daily - 7x weekly Supine Shoulder External Rotation with Dowel - 10 reps - 5 seconds hold - 1x daily - 7x weekly Shoulder Scaption AAROM with Dowel - 10 reps - 3-5 seconds hold - 1x daily - 7x weekly Doorway Pec Stretch at 90 Degrees Abduction - 3 reps - 1 sets - 30-60seconds hold - 2x daily - 7x weekly Sidelying ITB Stretch off Table - 1 reps - 1 sets - 1-5 min hold - 1x daily - 7x weekly Shoulder External Rotation with Anchored Resistance - 10 reps - 1-3 sets - 1x daily - 7x weekly Standing Row with Anchored Resistance - 10 reps - 1-3 sets - 1x daily - 7x weekly Shoulder Internal Rotation with Resistance - 10 reps - 1-3 sets - 1x daily - 7x weekly Shoulder extension with resistance - Neutral - 10 reps - 1-3 sets - 1x daily - 7x weekly Supine Single Knee to Chest - 3 reps - 1 sets - 30 sec hold - 1x daily - 7x weekly Supine Lower Trunk Rotation - 5 reps - 1 sets - 10 sec hold hold - 1x daily - 7x weekly Sidelying Thoracic Rotation with Open Book - 3 reps - 1 sets - 20 sec hold - 1x daily - 7x weekly Patient Education Trigger Point Dry Needling

## 2019-11-08 ENCOUNTER — Encounter: Payer: BC Managed Care – PPO | Admitting: Physical Therapy

## 2019-11-15 ENCOUNTER — Encounter: Payer: BC Managed Care – PPO | Admitting: Physical Therapy

## 2019-11-16 ENCOUNTER — Emergency Department
Admission: EM | Admit: 2019-11-16 | Discharge: 2019-11-16 | Disposition: A | Discharge status: Home / Self Care | Payer: BC Managed Care – PPO | Source: Home / Self Care

## 2019-11-16 ENCOUNTER — Other Ambulatory Visit: Payer: Self-pay

## 2019-11-16 DIAGNOSIS — M5442 Lumbago with sciatica, left side: Secondary | ICD-10-CM

## 2019-11-16 MED ORDER — CYCLOBENZAPRINE HCL 10 MG PO TABS: 5.0000 mg | ORAL_TABLET | Freq: Two times a day (BID) | ORAL | 0 refills | Status: AC | PRN

## 2019-11-16 MED ORDER — PREDNISONE 10 MG (21) PO TBPK: ORAL_TABLET | Freq: Every day | ORAL | 0 refills | Status: DC

## 2019-11-16 MED ORDER — DEXAMETHASONE SODIUM PHOSPHATE 10 MG/ML IJ SOLN
10.0000 mg | Freq: Once | INTRAMUSCULAR | Status: AC
  Administered 2019-11-16: 10 mg via INTRAMUSCULAR

## 2019-11-16 NOTE — ED Provider Notes (Signed)
[facility]    CSN: [id number] Arrival date & time: 11/16/19  0804      History   Chief Complaint Chief Complaint  Patient presents with  . Back Pain    HPI Joseph Mathews is a 54 y.o. male.   54 year-old Male, with history of hypertension, psoriatic arthritis, chronic low back pain, presenting today complaining of low back pain.  Patient states that he was playing tennis with his daughter 2 days ago and developed left-sided lumbar back pain while playing.  States that he rested yesterday but had worsening pain this morning when he woke up.  States that it is radiated down his left buttock and left posterior thigh.  He denies any loss of bowel or bladder function, saddle anesthesia, numbness or weakness in the lower extremities.  MRI lumbar spine 05/23/2019 IMPRESSION: 1. Mild disc bulging at L3-4 and L4-5 without significant compressive stenosis. 2. Left lateral annular tear may be irritating to the nerve roots at L4-5.   Back Pain Location:  Lumbar spine Quality:  Aching Radiates to:  R posterior upper leg Pain severity:  Moderate Pain is:  Same all the time Onset quality:  Gradual Duration:  3 days Timing:  Constant Progression:  Unchanged Chronicity:  Recurrent Context: not emotional stress, not falling, not jumping from heights, not lifting heavy objects, not MCA, not MVA, not occupational injury and not pedestrian accident   Relieved by:  Nothing Worsened by:  Ambulation, bending and twisting Ineffective treatments:  None tried Associated symptoms: leg pain   Associated symptoms: no abdominal pain, no abdominal swelling, no bladder incontinence, no bowel incontinence, no chest pain, no dysuria, no fever, no headaches, no numbness, no paresthesias, no pelvic pain, no perianal numbness, no tingling, no weakness and no weight loss   Risk factors: no hx of cancer, no hx of osteoporosis, no lack of exercise, no menopause, not obese, not pregnant, no recent  surgery, no steroid use and no vascular disease     Past Medical History:  Diagnosis Date  . Hypertension   . Psoriatic arthritis [facility])     Patient Active Problem List   Diagnosis Date Noted  . Bilateral shoulder pain 06/11/2019  . Psoriatic arthritis (HCC) 06/11/2019  . Left wrist pain 06/11/2019    Past Surgical History:  Procedure Laterality Date  . NO PAST SURGERIES         Home Medications    Prior to Admission medications   Medication Sig Start Date End Date Taking? Authorizing Provider  amLODipine (NORVASC) 5 MG tablet Take 1 tablet QD 02/14/19   [provider]  COSENTYX, 300 MG DOSE, 150 MG/ML SOSY  05/14/19   [provider]  cyclobenzaprine (FLEXERIL) 10 MG tablet Take 0.5-1 tablets (5-10 mg total) by mouth 2 (two) times daily as needed for muscle spasms. 11/16/19   Joshua Soulier C, PA-C  meloxicam (MOBIC) 15 MG tablet One tab PO qAM with breakfast for 2 weeks, then daily prn pain. 06/11/19   Monica Becton, MD  predniSONE (STERAPRED UNI-PAK 21 TAB) 10 MG (21) TBPK tablet Take by mouth daily. Take 6 tabs by mouth daily  for 2 days, then 5 tabs for 2 days, then 4 tabs for 2 days, then 3 tabs for 2 days, 2 tabs for 2 days, then 1 tab by mouth daily for 2 days 11/16/19   Alecia Lemming, PA-C    Family History No family history on file.  Social History Social History  Tobacco Use  . Smoking status: Never Smoker  . Smokeless tobacco: Never Used  Substance Use Topics  . Alcohol use: Yes    Comment: occ  . Drug use: Not on file     Allergies   Sulfamethoxazole-trimethoprim   Review of Systems Review of Systems  Constitutional: Negative for chills, fever and weight loss.  HENT: Negative for ear pain and sore throat.   Eyes: Negative for pain and visual disturbance.  Respiratory: Negative for cough and shortness of breath.   Cardiovascular: Negative for chest pain and palpitations.  Gastrointestinal: Negative for abdominal pain, bowel  incontinence and vomiting.  Genitourinary: Negative for bladder incontinence, dysuria, hematuria and pelvic pain.  Musculoskeletal: Positive for back pain. Negative for arthralgias.  Skin: Negative for color change and rash.  Neurological: Negative for tingling, seizures, syncope, weakness, numbness, headaches and paresthesias.  All other systems reviewed and are negative.    Physical Exam Triage Vital Signs ED Triage Vitals [11/16/19 0817]  Enc Vitals Group     BP (!) 150/89     Pulse Rate 95     Resp 18     Temp 98 F (36.7 C)     Temp Source Oral     SpO2 99 %     Weight 182 lb (82.6 kg)     Height 5\' 10"  (1.778 m)     Head Circumference      Peak Flow      Pain Score 9     Pain Loc      Pain Edu?      Excl. in [place]?    No data found.  Updated Vital Signs BP (!) 150/89 (BP Location: Right Arm)   Pulse 95   Temp 98 F (36.7 C) (Oral)   Resp 18   Ht 5\' 10"  (1.778 m)   Wt 182 lb (82.6 kg)   SpO2 99%   BMI 26.11 kg/m   Visual Acuity Right Eye Distance:   Left Eye Distance:   Bilateral Distance:    Right Eye Near:   Left Eye Near:    Bilateral Near:     Physical Exam Vitals and nursing note reviewed.  Constitutional:      Appearance: He is well-developed.  HENT:     Head: Normocephalic and atraumatic.  Eyes:     Conjunctiva/sclera: Conjunctivae normal.  Cardiovascular:     Rate and Rhythm: Normal rate and regular rhythm.     Heart sounds: No murmur.  Pulmonary:     Effort: Pulmonary effort is normal. No respiratory distress.     Breath sounds: Normal breath sounds.  Abdominal:     Palpations: Abdomen is soft.     Tenderness: There is no abdominal tenderness.  Musculoskeletal:     Cervical back: Neck supple.     Lumbar back: Spasms and tenderness present.       Back:  Skin:    General: Skin is warm and dry.  Neurological:     Mental Status: He is alert and oriented to person, place, and time.     GCS: GCS eye subscore is 4. GCS verbal subscore  is 5. GCS motor subscore is 6.     Cranial Nerves: No cranial nerve deficit.     Sensory: No sensory deficit.     Motor: Motor function is intact.     Coordination: Coordination is intact.     Gait: Gait is intact.     Deep Tendon Reflexes: Reflexes are normal and  symmetric.     Comments: Normal strength and sensation the lower extremities bilaterally Steady gait      UC Treatments / Results  Labs (all labs ordered are listed, but only abnormal results are displayed) Labs Reviewed - No data to display  EKG   Radiology No results found.  Procedures Procedures (including critical care time)  Medications Ordered in UC Medications  dexamethasone (DECADRON) injection 10 mg (has no administration in time range)    Initial Impression / Assessment and Plan / UC Course  I have reviewed the triage vital signs and the nursing notes.  Pertinent labs & imaging results that were available during my care of the patient were reviewed by me and considered in my medical decision making (see chart for details).     Acute exacerbation of chronic low back pain with left-sided sciatica.  No red flag symptoms.  Will treat with a dose of IM steroids and prescribed tapering dose of steroids with muscle relaxer. Final Clinical Impressions(s) / UC Diagnoses   Final diagnoses:  Acute left-sided low back pain with left-sided sciatica   Discharge Instructions   None    ED Prescriptions    Medication Sig Dispense Auth. Provider   predniSONE (STERAPRED UNI-PAK 21 TAB) 10 MG (21) TBPK tablet Take by mouth daily. Take 6 tabs by mouth daily  for 2 days, then 5 tabs for 2 days, then 4 tabs for 2 days, then 3 tabs for 2 days, 2 tabs for 2 days, then 1 tab by mouth daily for 2 days 42 tablet Jeydi Klingel C, PA-C   cyclobenzaprine (FLEXERIL) 10 MG tablet Take 0.5-1 tablets (5-10 mg total) by mouth 2 (two) times daily as needed for muscle spasms. 20 tablet Ajanae Virag C, PA-C     PDMP not reviewed  this encounter.   Alecia Lemming, [place] 11/16/19 0830

## 2019-11-16 NOTE — ED Triage Notes (Signed)
Pt c/o back pain x 2 days. Hx of back pain, usually prednisone helps. Pain 9/10. Taking Meloxicam prn.

## 2020-01-02 ENCOUNTER — Other Ambulatory Visit: Payer: Self-pay

## 2020-01-02 ENCOUNTER — Encounter: Payer: Self-pay | Admitting: Emergency Medicine

## 2020-01-02 ENCOUNTER — Emergency Department
Admission: EM | Admit: 2020-01-02 | Discharge: 2020-01-02 | Disposition: A | Discharge status: Home / Self Care | Payer: BC Managed Care – PPO | Source: Home / Self Care

## 2020-01-02 DIAGNOSIS — S39012D Strain of muscle, fascia and tendon of lower back, subsequent encounter: Secondary | ICD-10-CM

## 2020-01-02 MED ORDER — PREDNISONE 20 MG PO TABS: ORAL_TABLET | ORAL | 0 refills | Status: AC

## 2020-01-02 MED ORDER — DEXAMETHASONE SODIUM PHOSPHATE 10 MG/ML IJ SOLN
10.0000 mg | Freq: Once | INTRAMUSCULAR | Status: AC
  Administered 2020-01-02: 10 mg via INTRAMUSCULAR

## 2020-01-02 NOTE — Discharge Instructions (Signed)
  You were given a shot of decadron (a steroid) today to help with muscle pain and inflammation. You may take your first prednisone pill tomorrow with breakfast since you received the first dose today.  Please follow up with family medicine or sports medicine as needed.  A referral to PT has been sent to the office at this facility, [facility].

## 2020-01-02 NOTE — ED Provider Notes (Signed)
[facility]    CSN: [id number] Arrival date & time: 01/02/20  0902      History   Chief Complaint Chief Complaint  Patient presents with  . Back Pain    left lower back    HPI Joseph Mathews is a 54 y.o. male.   HPI  Joseph Mathews is a 54 y.o. male presenting to UC with c/o Left lower back pain that started on Sunday, 3/21, after playing tennis. No specific known injury. Pain is a dull ache, occasional sharp with certain movements. Minimal pain radiating into Left thigh but denies weakness or numbness in arms or legs. He has tried ice and heat with mild relief.  He has also taken mobic with moderate relief. Hx of similar back pain, most recently seen at [facility] for same on 11/16/2019.  He was given Decadron 10mg  IM and prescribed a prednisone taper pack and flexeril. Pt states he feels the prednisone helped the most. He would like to try prednisone again to get him through until he can establish PT for his back.     Past Medical History:  Diagnosis Date  . Hypertension   . Psoriatic arthritis [facility])     Patient Active Problem List   Diagnosis Date Noted  . Bilateral shoulder pain 06/11/2019  . Psoriatic arthritis ([place]) 06/11/2019  . Left wrist pain 06/11/2019    Past Surgical History:  Procedure Laterality Date  . NO PAST SURGERIES         Home Medications    Prior to Admission medications   Medication Sig Start Date End Date Taking? Authorizing Provider  amLODipine (NORVASC) 5 MG tablet Take 1 tablet QD 02/14/19  Yes [provider]  aspirin 81 MG chewable tablet Chew 81 mg by mouth daily.   Yes [provider]  cholecalciferol (VITAMIN D3) 25 MCG (1000 UNIT) tablet Take 1,000 Units by mouth daily.   Yes [provider]  COSENTYX, 300 MG DOSE, 150 MG/ML SOSY  05/14/19   [provider]  cyclobenzaprine (FLEXERIL) 10 MG tablet Take 0.5-1 tablets (5-10 mg total) by mouth 2 (two) times daily as needed for muscle spasms. 11/16/19    Blue, Olivia C, PA-C  meloxicam (MOBIC) 15 MG tablet One tab PO qAM with breakfast for 2 weeks, then daily prn pain. 06/11/19   Silverio Decamp, MD  predniSONE (DELTASONE) 20 MG tablet 3 tabs po day one, then 2 po daily x 4 days 01/02/20   Noe Gens, PA-C    Family History Family History  Problem Relation Age of Onset  . Cancer Mother   . Cancer Father   . Kidney failure Brother     Social History Social History   Tobacco Use  . Smoking status: Never Smoker  . Smokeless tobacco: Never Used  Substance Use Topics  . Alcohol use: Yes    Comment: occ  . Drug use: Never     Allergies   Sulfamethoxazole-trimethoprim   Review of Systems Review of Systems  Constitutional: Negative for chills and fever.  Genitourinary: Negative for dysuria, frequency and hematuria.  Musculoskeletal: Positive for back pain and myalgias. Negative for arthralgias, gait problem, joint swelling, neck pain and neck stiffness.  Skin: Negative for rash.     Physical Exam Triage Vital Signs ED Triage Vitals  Enc Vitals Group     BP 01/02/20 0918 (!) 163/93     Pulse Rate 01/02/20 0918 94     Resp --  Temp 01/02/20 0918 98.4 F (36.9 C)     Temp Source 01/02/20 0918 Oral     SpO2 01/02/20 0918 99 %     Weight 01/02/20 0920 181 lb 15.8 oz (82.6 kg)     Height 01/02/20 0920 5\' 10"  (1.778 m)     Head Circumference --      Peak Flow --      Pain Score 01/02/20 0918 7     Pain Loc --      Pain Edu? --      Excl. in [place]? --    No data found.  Updated Vital Signs BP (!) 163/93 (BP Location: Left Arm)   Pulse 94   Temp 98.4 F (36.9 C) (Oral)   Ht 5\' 10"  (1.778 m)   Wt 181 lb 15.8 oz (82.6 kg)   SpO2 99%   BMI 26.11 kg/m   Visual Acuity Right Eye Distance:   Left Eye Distance:   Bilateral Distance:    Right Eye Near:   Left Eye Near:    Bilateral Near:     Physical Exam Vitals and nursing note reviewed.  Constitutional:      General: He is not in acute distress.     Appearance: Normal appearance. He is well-developed. He is not ill-appearing, toxic-appearing or diaphoretic.  HENT:     Head: Normocephalic and atraumatic.  Cardiovascular:     Rate and Rhythm: Normal rate.  Pulmonary:     Effort: Pulmonary effort is normal. No respiratory distress.  Musculoskeletal:        General: Tenderness present. Normal range of motion.     Cervical back: Normal range of motion.     Comments: No spinal tenderness. Tenderness to Left lower lumbar muscle. Palpable muscle spasm. Full ROM upper and lower extremities. Negative straight leg raise.  Increased pain with rotation at the waist to the right and flexion at the waist.   Skin:    General: Skin is warm and dry.     Findings: No rash.  Neurological:     Mental Status: He is alert and oriented to person, place, and time.  Psychiatric:        Behavior: Behavior normal.      UC Treatments / Results  Labs (all labs ordered are listed, but only abnormal results are displayed) Labs Reviewed - No data to display  EKG   Radiology No results found.  Procedures Procedures (including critical care time)  Medications Ordered in UC Medications  dexamethasone (DECADRON) injection 10 mg (10 mg Intramuscular Given 01/02/20 0943)    Initial Impression / Assessment and Plan / UC Course  I have reviewed the triage vital signs and the nursing notes.  Pertinent labs & imaging results that were available during my care of the patient were reviewed by me and considered in my medical decision making (see chart for details).     Hx and exam c/w muscle strain and spasm w/o red flag symptoms Decadron 10mg  IM given in UC Five days of oral prednisone prescribed for pt to start tomorrow. Discussed taking with food, especially if he continues to take meloxicam to help reduce risk of GI bleed. Encouraged f/u with PCP and sports medicine. Referral placed for PT at [facility]. AVS provided  Final Clinical  Impressions(s) / UC Diagnoses   Final diagnoses:  Low back strain, subsequent encounter     Discharge Instructions      You were given a shot of decadron (a steroid)  today to help with muscle pain and inflammation. You may take your first prednisone pill tomorrow with breakfast since you received the first dose today.  Please follow up with family medicine or sports medicine as needed.  A referral to PT has been sent to the office at this facility, [facility].      ED Prescriptions    Medication Sig Dispense Auth. Provider   predniSONE (DELTASONE) 20 MG tablet 3 tabs po day one, then 2 po daily x 4 days 11 tablet Lurene Shadow, [place]     I have reviewed the PDMP during this encounter.   Lurene Shadow, [place] 01/02/20 1102

## 2020-01-02 NOTE — ED Triage Notes (Signed)
C/o left lower back pain after playing tennis on Sunday afternoon strained muscles per patient - dull ache ice on Sunday - heat at work - min relief mobic am 3/23 helped w/ pain

## 2020-01-03 ENCOUNTER — Ambulatory Visit: Payer: BC Managed Care – PPO | Admitting: Rehabilitative and Restorative Service Providers"

## 2020-11-09 IMAGING — MR MR SHOULDER*R* W/O CM
5 series · 40 of 40 positions shown · non-contrast
Comparison: Right shoulder x-rays dated June 11, 2019.

CLINICAL DATA: Bilateral shoulder pain and weakness for the past 2
months.

EXAM:
MRI OF THE RIGHT SHOULDER WITHOUT CONTRAST
TECHNIQUE: Multiplanar, multisequence MR imaging of the shoulder was performed.
No intravenous contrast was administered.

[Series 6: PD fat-sat · axial · 4.0mm · 0.59mm/px · z∈[+9,+97]mm · 10 of 22 slices shown (1 of 2)]
[im 1/22]
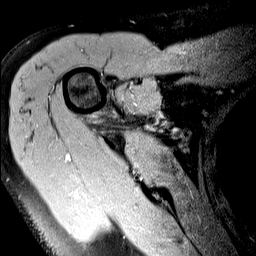
[im 3/22]
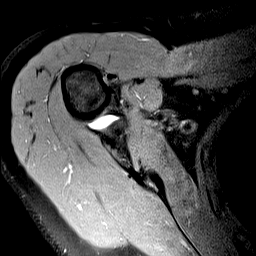
[im 5/22]
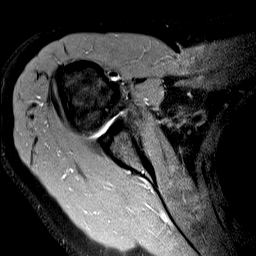
[im 8/22]
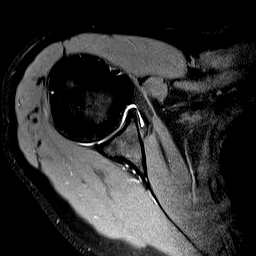
[im 10/22]
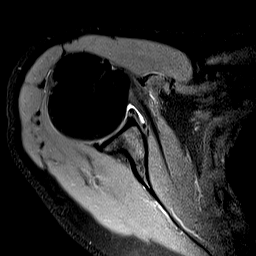
[im 12/22]
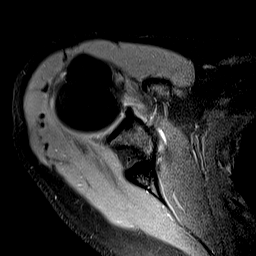
[im 15/22]
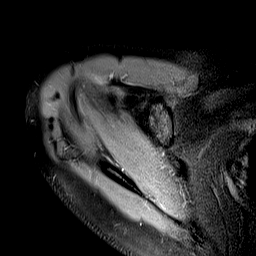
[im 17/22]
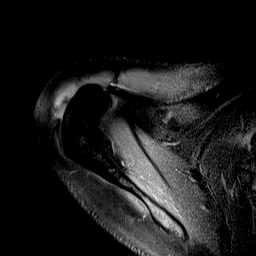
[im 19/22]
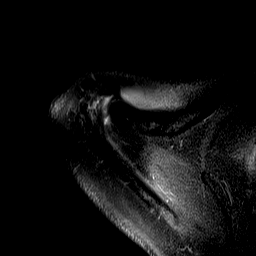
[im 22/22]
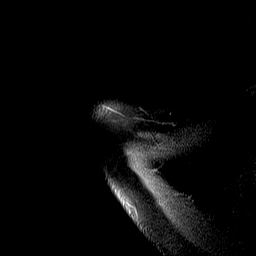

[Series 7: T2 fat-sat · oblique · 4.0mm · 0.59mm/px · 7 of 17 slices shown (1 of 2)]
[im 1/17]
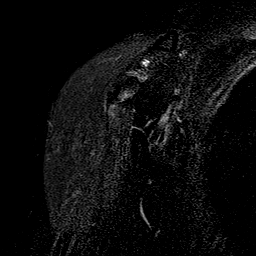
[im 3/17]
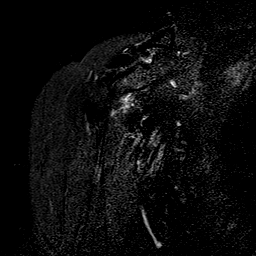
[im 6/17]
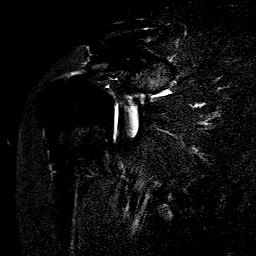
[im 9/17]
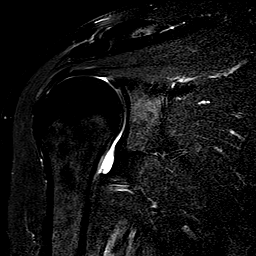
[im 11/17]
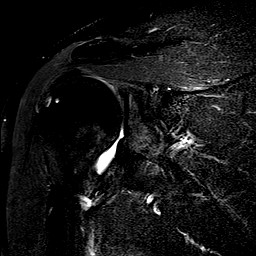
[im 14/17]
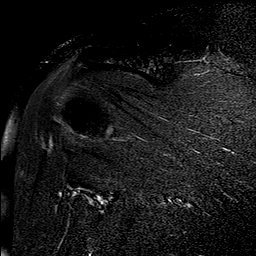
[im 17/17]
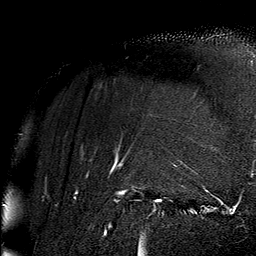

[Series 8: PD fat-sat · oblique · 4.0mm · 0.59mm/px · 7 of 17 slices shown (2 of 2)]
[im 1/17]
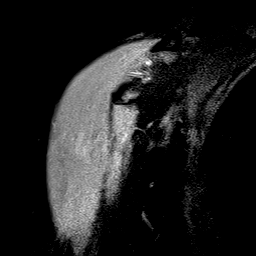
[im 3/17]
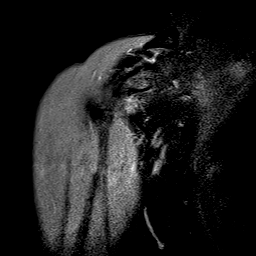
[im 6/17]
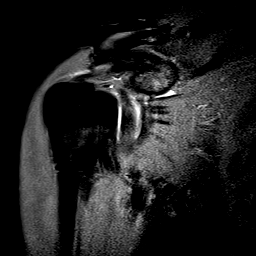
[im 9/17]
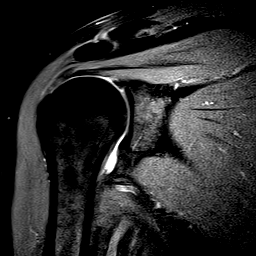
[im 11/17]
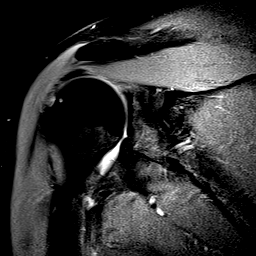
[im 14/17]
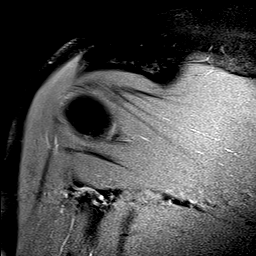
[im 17/17]
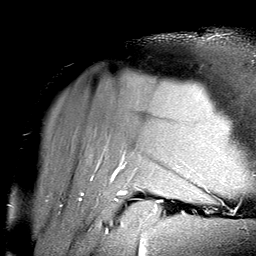

[Series 9: T1 · oblique · 4.0mm · 0.59mm/px · 8 of 20 slices shown]
[im 1/20]
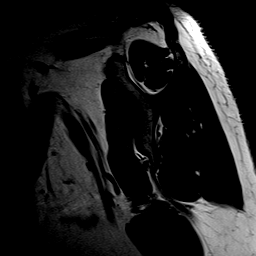
[im 3/20]
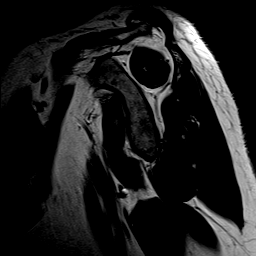
[im 6/20]
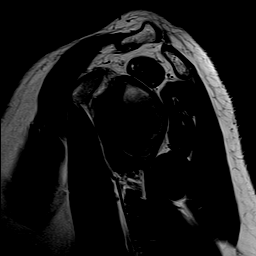
[im 9/20]
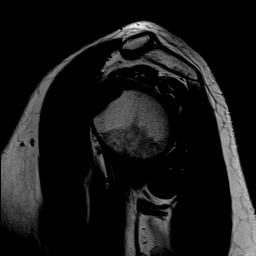
[im 11/20]
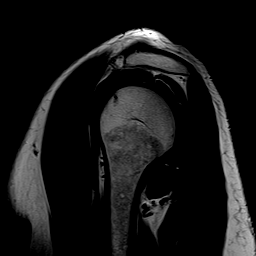
[im 14/20]
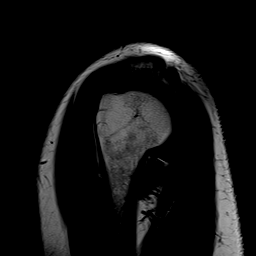
[im 17/20]
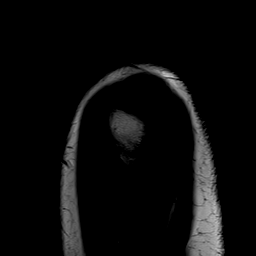
[im 20/20]
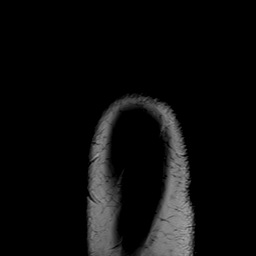

[Series 10: T2 fat-sat · oblique · 4.0mm · 0.59mm/px · 8 of 20 slices shown (2 of 2)]
[im 1/20]
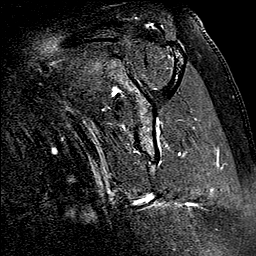
[im 3/20]
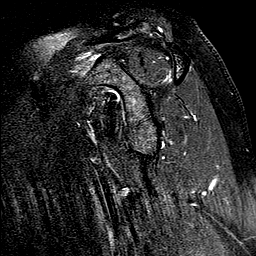
[im 6/20]
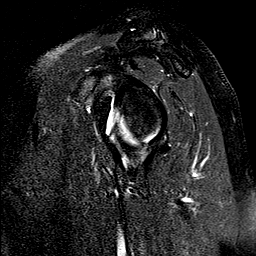
[im 9/20]
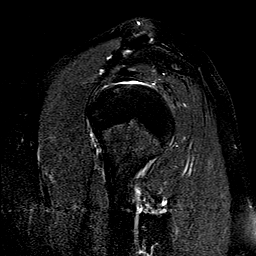
[im 11/20]
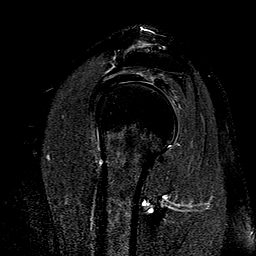
[im 14/20]
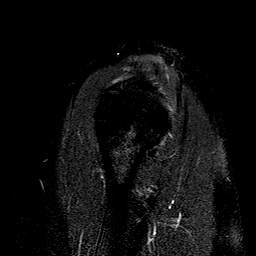
[im 17/20]
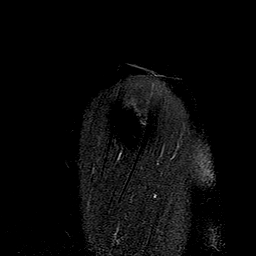
[im 20/20]
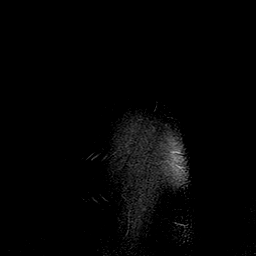

[40 of 40 positions shown; findings below may reference images not displayed]

FINDINGS: Rotator cuff: Articular surface fraying of the infraspinatus tendon
at the insertion. The supraspinatus, subscapularis, and teres minor
tendons are unremarkable.

Muscles: No atrophy or abnormal signal of the muscles of the rotator
cuff.

Biceps long head:  Intact and normally positioned.

Acromioclavicular Joint: Normal acromioclavicular joint. Type I
acromion. No subacromial/subdeltoid bursal fluid.

Glenohumeral Joint: Trace joint effusion.  No chondral defect.

Labrum: Grossly intact, but evaluation is limited by lack of
intraarticular fluid.

Bones:  No marrow abnormality, fracture or dislocation.

Other: None.
IMPRESSION: 1. Insertional articular surface fraying of the infraspinatus
tendon. No high-grade rotator cuff tendon tear.
2. Unremarkable biceps long head tendon.
